# Patient Record
Sex: Female | Born: 1935 | Race: White | Hispanic: No
Health system: Southern US, Community
[De-identification: ages and names within clinical notes are randomized; demographics above are authoritative.]

---

## 2016-10-17 ENCOUNTER — Inpatient Hospital Stay
Admission: AD | Admit: 2016-10-17 | Discharge: 2016-11-25 | Disposition: A | Discharge status: Skilled Nursing Facility | Payer: Self-pay | Source: Other Acute Inpatient Hospital | Attending: Internal Medicine | Admitting: Internal Medicine

## 2016-10-17 ENCOUNTER — Other Ambulatory Visit (HOSPITAL_COMMUNITY): Payer: Self-pay

## 2016-10-17 DIAGNOSIS — Z931 Gastrostomy status: Secondary | ICD-10-CM

## 2016-10-17 DIAGNOSIS — I639 Cerebral infarction, unspecified: Secondary | ICD-10-CM

## 2016-10-17 DIAGNOSIS — J9690 Respiratory failure, unspecified, unspecified whether with hypoxia or hypercapnia: Secondary | ICD-10-CM

## 2016-10-17 DIAGNOSIS — R109 Unspecified abdominal pain: Secondary | ICD-10-CM

## 2016-10-17 DIAGNOSIS — J189 Pneumonia, unspecified organism: Secondary | ICD-10-CM

## 2016-10-17 DIAGNOSIS — D72829 Elevated white blood cell count, unspecified: Secondary | ICD-10-CM

## 2016-10-17 DIAGNOSIS — Z431 Encounter for attention to gastrostomy: Secondary | ICD-10-CM

## 2016-10-17 DIAGNOSIS — Z9911 Dependence on respirator [ventilator] status: Secondary | ICD-10-CM

## 2016-10-17 MED ORDER — IOPAMIDOL (ISOVUE-300) INJECTION 61%
50.0000 mL | Freq: Once | INTRAVENOUS | Status: AC | PRN
  Administered 2016-10-17: 50 mL

## 2016-10-18 LAB — CBC
HEMATOCRIT: 26.2 % — AB (ref 36.0–46.0)
Hemoglobin: 8.6 g/dL — ABNORMAL LOW (ref 12.0–15.0)
MCH: 31.5 pg (ref 26.0–34.0)
MCHC: 32.8 g/dL (ref 30.0–36.0)
MCV: 96 fL (ref 78.0–100.0)
PLATELETS: 111 10*3/uL — AB (ref 150–400)
RBC: 2.73 MIL/uL — ABNORMAL LOW (ref 3.87–5.11)
RDW: 18.1 % — AB (ref 11.5–15.5)
WBC: 9.7 10*3/uL (ref 4.0–10.5)

## 2016-10-18 LAB — BASIC METABOLIC PANEL
Anion gap: 10 (ref 5–15)
BUN: 41 mg/dL — AB (ref 6–20)
CALCIUM: 7.9 mg/dL — AB (ref 8.9–10.3)
CO2: 28 mmol/L (ref 22–32)
CREATININE: 0.74 mg/dL (ref 0.44–1.00)
Chloride: 100 mmol/L — ABNORMAL LOW (ref 101–111)
GFR calc Af Amer: >60 mL/min (ref >60)
Glucose, Bld: 278 mg/dL — ABNORMAL HIGH (ref 65–99)
POTASSIUM: 3.9 mmol/L (ref 3.5–5.1)
SODIUM: 138 mmol/L (ref 135–145)

## 2016-10-23 LAB — URINALYSIS, ROUTINE W REFLEX MICROSCOPIC
Bacteria, UA: NONE SEEN
Bilirubin Urine: NEGATIVE
Glucose, UA: 50 mg/dL — AB
Hgb urine dipstick: NEGATIVE
Ketones, ur: NEGATIVE mg/dL
Nitrite: NEGATIVE
Protein, ur: NEGATIVE mg/dL
SPECIFIC GRAVITY, URINE: 1.009 (ref 1.005–1.030)
SQUAMOUS EPITHELIAL / LPF: NONE SEEN
pH: 7 (ref 5.0–8.0)

## 2016-10-24 ENCOUNTER — Other Ambulatory Visit (HOSPITAL_COMMUNITY): Payer: Self-pay

## 2016-10-24 LAB — COMPREHENSIVE METABOLIC PANEL
ALT: 29 U/L (ref 14–54)
AST: 31 U/L (ref 15–41)
Albumin: 2.1 g/dL — ABNORMAL LOW (ref 3.5–5.0)
Alkaline Phosphatase: 81 U/L (ref 38–126)
Anion gap: 8 (ref 5–15)
BUN: 32 mg/dL — ABNORMAL HIGH (ref 6–20)
CO2: 36 mmol/L — ABNORMAL HIGH (ref 22–32)
Calcium: 8.5 mg/dL — ABNORMAL LOW (ref 8.9–10.3)
Chloride: 96 mmol/L — ABNORMAL LOW (ref 101–111)
Creatinine, Ser: 0.64 mg/dL (ref 0.44–1.00)
GFR calc Af Amer: >60 mL/min (ref >60)
GFR calc non Af Amer: >60 mL/min (ref >60)
Glucose, Bld: 276 mg/dL — ABNORMAL HIGH (ref 65–99)
Potassium: 4.5 mmol/L (ref 3.5–5.1)
Sodium: 140 mmol/L (ref 135–145)
Total Bilirubin: 0.9 mg/dL (ref 0.3–1.2)
Total Protein: 5.2 g/dL — ABNORMAL LOW (ref 6.5–8.1)

## 2016-10-24 LAB — URINE CULTURE: Culture: <10000 — AB

## 2016-10-24 LAB — CBC
HCT: 30.2 % — ABNORMAL LOW (ref 36.0–46.0)
Hemoglobin: 9.2 g/dL — ABNORMAL LOW (ref 12.0–15.0)
MCH: 30.6 pg (ref 26.0–34.0)
MCHC: 30.5 g/dL (ref 30.0–36.0)
MCV: 100.3 fL — ABNORMAL HIGH (ref 78.0–100.0)
Platelets: UNDETERMINED 10*3/uL (ref 150–400)
RBC: 3.01 MIL/uL — ABNORMAL LOW (ref 3.87–5.11)
RDW: 18.6 % — ABNORMAL HIGH (ref 11.5–15.5)
WBC: 6.6 10*3/uL (ref 4.0–10.5)

## 2016-10-29 ENCOUNTER — Other Ambulatory Visit (HOSPITAL_COMMUNITY): Payer: Self-pay

## 2016-10-29 LAB — BASIC METABOLIC PANEL
Anion gap: 9 (ref 5–15)
BUN: 39 mg/dL — ABNORMAL HIGH (ref 6–20)
CHLORIDE: 94 mmol/L — AB (ref 101–111)
CO2: 41 mmol/L — AB (ref 22–32)
CREATININE: 0.68 mg/dL (ref 0.44–1.00)
Calcium: 9.2 mg/dL (ref 8.9–10.3)
GFR calc Af Amer: >60 mL/min (ref >60)
GFR calc non Af Amer: >60 mL/min (ref >60)
Glucose, Bld: 316 mg/dL — ABNORMAL HIGH (ref 65–99)
Potassium: 4.5 mmol/L (ref 3.5–5.1)
SODIUM: 144 mmol/L (ref 135–145)

## 2016-11-01 ENCOUNTER — Other Ambulatory Visit (HOSPITAL_COMMUNITY): Payer: Self-pay

## 2016-11-01 LAB — CBC WITH DIFFERENTIAL/PLATELET
BASOS PCT: 1 %
Basophils Absolute: 0.1 10*3/uL (ref 0.0–0.1)
Eosinophils Absolute: 0 10*3/uL (ref 0.0–0.7)
Eosinophils Relative: 0 %
HEMATOCRIT: 35.1 % — AB (ref 36.0–46.0)
HEMOGLOBIN: 9.5 g/dL — AB (ref 12.0–15.0)
LYMPHS ABS: 1.7 10*3/uL (ref 0.7–4.0)
LYMPHS PCT: 13 %
MCH: 30.4 pg (ref 26.0–34.0)
MCHC: 27.1 g/dL — AB (ref 30.0–36.0)
MCV: 112.1 fL — AB (ref 78.0–100.0)
MONOS PCT: 12 %
Monocytes Absolute: 1.6 10*3/uL — ABNORMAL HIGH (ref 0.1–1.0)
NEUTROS ABS: 9.9 10*3/uL — AB (ref 1.7–7.7)
Neutrophils Relative %: 74 %
Platelets: 280 10*3/uL (ref 150–400)
RBC: 3.13 MIL/uL — ABNORMAL LOW (ref 3.87–5.11)
RDW: 17.7 % — ABNORMAL HIGH (ref 11.5–15.5)
WBC MORPHOLOGY: INCREASED
WBC: 13.3 10*3/uL — ABNORMAL HIGH (ref 4.0–10.5)

## 2016-11-01 LAB — BLOOD GAS, ARTERIAL
ACID-BASE EXCESS: 14.2 mmol/L — AB (ref 0.0–2.0)
Acid-Base Excess: 16.3 mmol/L — ABNORMAL HIGH (ref 0.0–2.0)
Bicarbonate: 45.3 mmol/L — ABNORMAL HIGH (ref 20.0–28.0)
Bicarbonate: 40.1 mmol/L — ABNORMAL HIGH (ref 20.0–28.0)
Drawn by: 290171
FIO2: 100
FIO2: 0.4
LHR: 18 {breaths}/min
O2 CONTENT: 15 L/min
O2 SAT: 97.8 %
O2 Saturation: 99.1 %
PATIENT TEMPERATURE: 98.6
PCO2 ART: 69.9 mmHg — AB (ref 32.0–48.0)
PEEP: 5 cmH2O
PH ART: 7.377 (ref 7.350–7.450)
PO2 ART: 91 mmHg (ref 83.0–108.0)
Patient temperature: 97.1
VT: 400 mL
pH, Arterial: 7.171 — CL (ref 7.350–7.450)
pO2, Arterial: 168 mmHg — ABNORMAL HIGH (ref 83.0–108.0)

## 2016-11-01 LAB — COMPREHENSIVE METABOLIC PANEL
ALK PHOS: 120 U/L (ref 38–126)
ALT: 34 U/L (ref 14–54)
AST: 32 U/L (ref 15–41)
Albumin: 2.1 g/dL — ABNORMAL LOW (ref 3.5–5.0)
Anion gap: 8 (ref 5–15)
BILIRUBIN TOTAL: 0.4 mg/dL (ref 0.3–1.2)
BUN: 47 mg/dL — AB (ref 6–20)
CALCIUM: 9.2 mg/dL (ref 8.9–10.3)
CHLORIDE: 95 mmol/L — AB (ref 101–111)
CO2: 44 mmol/L — ABNORMAL HIGH (ref 22–32)
CREATININE: 0.96 mg/dL (ref 0.44–1.00)
GFR, EST NON AFRICAN AMERICAN: 55 mL/min — AB (ref >60)
Glucose, Bld: 351 mg/dL — ABNORMAL HIGH (ref 65–99)
Potassium: 4.5 mmol/L (ref 3.5–5.1)
Sodium: 147 mmol/L — ABNORMAL HIGH (ref 135–145)
TOTAL PROTEIN: 6.5 g/dL (ref 6.5–8.1)

## 2016-11-01 LAB — URINALYSIS, ROUTINE W REFLEX MICROSCOPIC
Bilirubin Urine: NEGATIVE
GLUCOSE, UA: 50 mg/dL — AB
KETONES UR: NEGATIVE mg/dL
NITRITE: NEGATIVE
PROTEIN: 30 mg/dL — AB
Specific Gravity, Urine: 1.01 (ref 1.005–1.030)
Squamous Epithelial / LPF: NONE SEEN
pH: 6 (ref 5.0–8.0)

## 2016-11-01 LAB — LACTIC ACID, PLASMA: Lactic Acid, Venous: 1.3 mmol/L (ref 0.5–1.9)

## 2016-11-01 LAB — PHOSPHORUS: PHOSPHORUS: 6.1 mg/dL — AB (ref 2.5–4.6)

## 2016-11-01 LAB — MAGNESIUM: MAGNESIUM: 2.2 mg/dL (ref 1.7–2.4)

## 2016-11-03 LAB — BLOOD GAS, ARTERIAL
ACID-BASE EXCESS: 18.1 mmol/L — AB (ref 0.0–2.0)
BICARBONATE: 43.3 mmol/L — AB (ref 20.0–28.0)
FIO2: 35
MECHVT: 400 mL
O2 Saturation: 93.6 %
PEEP: 5 cmH2O
PH ART: 7.469 — AB (ref 7.350–7.450)
Patient temperature: 98.6
RATE: 24 resp/min
pCO2 arterial: 60.3 mmHg — ABNORMAL HIGH (ref 32.0–48.0)
pO2, Arterial: 66.8 mmHg — ABNORMAL LOW (ref 83.0–108.0)

## 2016-11-03 LAB — URINE CULTURE: Culture: >=100000 — AB

## 2016-11-04 ENCOUNTER — Other Ambulatory Visit (HOSPITAL_COMMUNITY): Payer: Self-pay

## 2016-11-04 ENCOUNTER — Encounter: Payer: Self-pay | Admitting: Radiology

## 2016-11-04 LAB — BLOOD GAS, ARTERIAL
ACID-BASE EXCESS: 17.8 mmol/L — AB (ref 0.0–2.0)
BICARBONATE: 42 mmol/L — AB (ref 20.0–28.0)
FIO2: 30
LHR: 16 {breaths}/min
Mode: 400
O2 SAT: 97.4 %
PATIENT TEMPERATURE: 97.4
PEEP: 5 cmH2O
pCO2 arterial: 45.2 mmHg (ref 32.0–48.0)
pH, Arterial: 7.572 — ABNORMAL HIGH (ref 7.350–7.450)
pO2, Arterial: 78.9 mmHg — ABNORMAL LOW (ref 83.0–108.0)

## 2016-11-04 LAB — BASIC METABOLIC PANEL
ANION GAP: 10 (ref 5–15)
BUN: 57 mg/dL — ABNORMAL HIGH (ref 6–20)
CHLORIDE: 102 mmol/L (ref 101–111)
CO2: 42 mmol/L — ABNORMAL HIGH (ref 22–32)
CREATININE: 0.88 mg/dL (ref 0.44–1.00)
Calcium: 9.1 mg/dL (ref 8.9–10.3)
GFR calc non Af Amer: >60 mL/min (ref >60)
Glucose, Bld: 100 mg/dL — ABNORMAL HIGH (ref 65–99)
POTASSIUM: 3.5 mmol/L (ref 3.5–5.1)
SODIUM: 154 mmol/L — AB (ref 135–145)

## 2016-11-04 LAB — CBC
HCT: 33.7 % — ABNORMAL LOW (ref 36.0–46.0)
Hemoglobin: 9.7 g/dL — ABNORMAL LOW (ref 12.0–15.0)
MCH: 29.8 pg (ref 26.0–34.0)
MCHC: 28.8 g/dL — ABNORMAL LOW (ref 30.0–36.0)
MCV: 103.4 fL — ABNORMAL HIGH (ref 78.0–100.0)
Platelets: 243 10*3/uL (ref 150–400)
RBC: 3.26 MIL/uL — AB (ref 3.87–5.11)
RDW: 18.9 % — ABNORMAL HIGH (ref 11.5–15.5)
WBC: 22.3 10*3/uL — AB (ref 4.0–10.5)

## 2016-11-04 LAB — PHOSPHORUS: PHOSPHORUS: 1.3 mg/dL — AB (ref 2.5–4.6)

## 2016-11-04 LAB — MAGNESIUM: MAGNESIUM: 2 mg/dL (ref 1.7–2.4)

## 2016-11-05 LAB — MAGNESIUM: Magnesium: 2 mg/dL (ref 1.7–2.4)

## 2016-11-05 LAB — CBC WITH DIFFERENTIAL/PLATELET
BASOS PCT: 1 %
Basophils Absolute: 0.3 10*3/uL — ABNORMAL HIGH (ref 0.0–0.1)
Eosinophils Absolute: 0.5 10*3/uL (ref 0.0–0.7)
Eosinophils Relative: 2 %
HEMATOCRIT: 32.6 % — AB (ref 36.0–46.0)
HEMOGLOBIN: 9.5 g/dL — AB (ref 12.0–15.0)
LYMPHS ABS: 5.2 10*3/uL — AB (ref 0.7–4.0)
Lymphocytes Relative: 20 %
MCH: 29.7 pg (ref 26.0–34.0)
MCHC: 29.1 g/dL — ABNORMAL LOW (ref 30.0–36.0)
MCV: 101.9 fL — AB (ref 78.0–100.0)
MONO ABS: 1.8 10*3/uL — AB (ref 0.1–1.0)
MONOS PCT: 7 %
NEUTROS ABS: 18.4 10*3/uL — AB (ref 1.7–7.7)
Neutrophils Relative %: 70 %
Platelets: 246 10*3/uL (ref 150–400)
RBC: 3.2 MIL/uL — ABNORMAL LOW (ref 3.87–5.11)
RDW: 18.9 % — AB (ref 11.5–15.5)
WBC: 26.2 10*3/uL — ABNORMAL HIGH (ref 4.0–10.5)

## 2016-11-05 LAB — TROPONIN I
TROPONIN I: 0.18 ng/mL — AB (ref <0.03)
TROPONIN I: 0.18 ng/mL — AB (ref <0.03)
Troponin I: 0.14 ng/mL (ref <0.03)

## 2016-11-05 LAB — C DIFFICILE QUICK SCREEN W PCR REFLEX
C DIFFICILE (CDIFF) TOXIN: NEGATIVE
C Diff antigen: NEGATIVE
C Diff interpretation: NOT DETECTED

## 2016-11-05 LAB — BASIC METABOLIC PANEL WITH GFR
Anion gap: 14 (ref 5–15)
BUN: 59 mg/dL — ABNORMAL HIGH (ref 6–20)
CO2: 38 mmol/L — ABNORMAL HIGH (ref 22–32)
Calcium: 8.5 mg/dL — ABNORMAL LOW (ref 8.9–10.3)
Chloride: 96 mmol/L — ABNORMAL LOW (ref 101–111)
Creatinine, Ser: 0.99 mg/dL (ref 0.44–1.00)
GFR calc Af Amer: >60 mL/min (ref >60)
GFR calc non Af Amer: 53 mL/min — ABNORMAL LOW (ref >60)
Glucose, Bld: 209 mg/dL — ABNORMAL HIGH (ref 65–99)
Potassium: 3.6 mmol/L (ref 3.5–5.1)
Sodium: 148 mmol/L — ABNORMAL HIGH (ref 135–145)

## 2016-11-05 LAB — PHOSPHORUS: PHOSPHORUS: 2.1 mg/dL — AB (ref 2.5–4.6)

## 2016-11-06 LAB — CBC WITH DIFFERENTIAL/PLATELET
Basophils Absolute: 0 10*3/uL (ref 0.0–0.1)
Basophils Relative: 0 %
EOS ABS: 0.8 10*3/uL — AB (ref 0.0–0.7)
Eosinophils Relative: 3 %
HCT: 31.4 % — ABNORMAL LOW (ref 36.0–46.0)
Hemoglobin: 9.5 g/dL — ABNORMAL LOW (ref 12.0–15.0)
LYMPHS ABS: 5.1 10*3/uL — AB (ref 0.7–4.0)
LYMPHS PCT: 19 %
MCH: 30.6 pg (ref 26.0–34.0)
MCHC: 30.3 g/dL (ref 30.0–36.0)
MCV: 101.3 fL — ABNORMAL HIGH (ref 78.0–100.0)
MONOS PCT: 8 %
Monocytes Absolute: 2.2 10*3/uL — ABNORMAL HIGH (ref 0.1–1.0)
NEUTROS ABS: 18.9 10*3/uL — AB (ref 1.7–7.7)
Neutrophils Relative %: 70 %
PLATELETS: 260 10*3/uL (ref 150–400)
RBC: 3.1 MIL/uL — AB (ref 3.87–5.11)
RDW: 18.5 % — ABNORMAL HIGH (ref 11.5–15.5)
WBC: 27 10*3/uL — AB (ref 4.0–10.5)

## 2016-11-06 LAB — RENAL FUNCTION PANEL
Albumin: 1.7 g/dL — ABNORMAL LOW (ref 3.5–5.0)
Anion gap: 11 (ref 5–15)
BUN: 64 mg/dL — AB (ref 6–20)
CALCIUM: 8.2 mg/dL — AB (ref 8.9–10.3)
CHLORIDE: 99 mmol/L — AB (ref 101–111)
CO2: 37 mmol/L — AB (ref 22–32)
CREATININE: 1.02 mg/dL — AB (ref 0.44–1.00)
GFR calc Af Amer: 59 mL/min — ABNORMAL LOW (ref >60)
GFR calc non Af Amer: 51 mL/min — ABNORMAL LOW (ref >60)
GLUCOSE: 203 mg/dL — AB (ref 65–99)
Phosphorus: 3 mg/dL (ref 2.5–4.6)
Potassium: 3.8 mmol/L (ref 3.5–5.1)
SODIUM: 147 mmol/L — AB (ref 135–145)

## 2016-11-06 LAB — CULTURE, RESPIRATORY

## 2016-11-06 LAB — CULTURE, BLOOD (ROUTINE X 2)
CULTURE: NO GROWTH
Culture: NO GROWTH
Special Requests: ADEQUATE

## 2016-11-06 LAB — CULTURE, RESPIRATORY W GRAM STAIN: Culture: NORMAL

## 2016-11-06 LAB — MAGNESIUM: Magnesium: 2.1 mg/dL (ref 1.7–2.4)

## 2016-11-07 ENCOUNTER — Other Ambulatory Visit (HOSPITAL_COMMUNITY): Payer: Self-pay

## 2016-11-07 LAB — COMPREHENSIVE METABOLIC PANEL
ALK PHOS: 156 U/L — AB (ref 38–126)
ALT: 29 U/L (ref 14–54)
AST: 37 U/L (ref 15–41)
Albumin: 1.8 g/dL — ABNORMAL LOW (ref 3.5–5.0)
Anion gap: 12 (ref 5–15)
BUN: 64 mg/dL — ABNORMAL HIGH (ref 6–20)
CALCIUM: 8.1 mg/dL — AB (ref 8.9–10.3)
CO2: 39 mmol/L — ABNORMAL HIGH (ref 22–32)
CREATININE: 1.12 mg/dL — AB (ref 0.44–1.00)
Chloride: 97 mmol/L — ABNORMAL LOW (ref 101–111)
GFR calc Af Amer: 53 mL/min — ABNORMAL LOW (ref >60)
GFR, EST NON AFRICAN AMERICAN: 46 mL/min — AB (ref >60)
Glucose, Bld: 144 mg/dL — ABNORMAL HIGH (ref 65–99)
Potassium: 3.7 mmol/L (ref 3.5–5.1)
Sodium: 148 mmol/L — ABNORMAL HIGH (ref 135–145)
Total Bilirubin: 0.4 mg/dL (ref 0.3–1.2)
Total Protein: 5.8 g/dL — ABNORMAL LOW (ref 6.5–8.1)

## 2016-11-07 LAB — CBC WITH DIFFERENTIAL/PLATELET
BASOS PCT: 1 %
Basophils Absolute: 0.2 10*3/uL — ABNORMAL HIGH (ref 0.0–0.1)
EOS PCT: 6 %
Eosinophils Absolute: 1.2 10*3/uL — ABNORMAL HIGH (ref 0.0–0.7)
HEMATOCRIT: 31.9 % — AB (ref 36.0–46.0)
Hemoglobin: 9.7 g/dL — ABNORMAL LOW (ref 12.0–15.0)
LYMPHS PCT: 18 %
Lymphs Abs: 3.7 10*3/uL (ref 0.7–4.0)
MCH: 30.6 pg (ref 26.0–34.0)
MCHC: 30.4 g/dL (ref 30.0–36.0)
MCV: 100.6 fL — AB (ref 78.0–100.0)
MONOS PCT: 8 %
Monocytes Absolute: 1.6 10*3/uL — ABNORMAL HIGH (ref 0.1–1.0)
NEUTROS PCT: 67 %
Neutro Abs: 13.7 10*3/uL — ABNORMAL HIGH (ref 1.7–7.7)
PLATELETS: 255 10*3/uL (ref 150–400)
RBC: 3.17 MIL/uL — ABNORMAL LOW (ref 3.87–5.11)
RDW: 18.4 % — AB (ref 11.5–15.5)
WBC: 20.4 10*3/uL — ABNORMAL HIGH (ref 4.0–10.5)

## 2016-11-07 LAB — PHOSPHORUS: Phosphorus: 3.1 mg/dL (ref 2.5–4.6)

## 2016-11-07 LAB — MAGNESIUM: Magnesium: 2.3 mg/dL (ref 1.7–2.4)

## 2016-11-07 LAB — ACID FAST SMEAR (AFB, MYCOBACTERIA): Acid Fast Smear: NEGATIVE

## 2016-11-08 LAB — CBC
HCT: 33.1 % — ABNORMAL LOW (ref 36.0–46.0)
HEMOGLOBIN: 9.8 g/dL — AB (ref 12.0–15.0)
MCH: 30.5 pg (ref 26.0–34.0)
MCHC: 29.6 g/dL — AB (ref 30.0–36.0)
MCV: 103.1 fL — ABNORMAL HIGH (ref 78.0–100.0)
PLATELETS: 282 10*3/uL (ref 150–400)
RBC: 3.21 MIL/uL — ABNORMAL LOW (ref 3.87–5.11)
RDW: 18.4 % — ABNORMAL HIGH (ref 11.5–15.5)
WBC: 21 10*3/uL — ABNORMAL HIGH (ref 4.0–10.5)

## 2016-11-08 LAB — RENAL FUNCTION PANEL
ALBUMIN: 1.8 g/dL — AB (ref 3.5–5.0)
Anion gap: 12 (ref 5–15)
BUN: 66 mg/dL — AB (ref 6–20)
CALCIUM: 8.2 mg/dL — AB (ref 8.9–10.3)
CO2: 35 mmol/L — AB (ref 22–32)
CREATININE: 1.14 mg/dL — AB (ref 0.44–1.00)
Chloride: 99 mmol/L — ABNORMAL LOW (ref 101–111)
GFR calc Af Amer: 52 mL/min — ABNORMAL LOW (ref >60)
GFR calc non Af Amer: 45 mL/min — ABNORMAL LOW (ref >60)
GLUCOSE: 128 mg/dL — AB (ref 65–99)
PHOSPHORUS: 2.9 mg/dL (ref 2.5–4.6)
Potassium: 3.8 mmol/L (ref 3.5–5.1)
Sodium: 146 mmol/L — ABNORMAL HIGH (ref 135–145)

## 2016-11-08 LAB — MAGNESIUM: Magnesium: 2.3 mg/dL (ref 1.7–2.4)

## 2016-11-09 LAB — CULTURE, BAL-QUANTITATIVE W GRAM STAIN
Culture: 3000 — AB
Special Requests: NORMAL

## 2016-11-09 LAB — CULTURE, BAL-QUANTITATIVE

## 2016-11-10 ENCOUNTER — Other Ambulatory Visit (HOSPITAL_COMMUNITY): Payer: Self-pay

## 2016-11-10 LAB — CULTURE, BLOOD (ROUTINE X 2)
CULTURE: NO GROWTH
Culture: NO GROWTH
Special Requests: ADEQUATE
Special Requests: ADEQUATE

## 2016-11-11 LAB — CBC WITH DIFFERENTIAL/PLATELET
BASOS ABS: 0.1 10*3/uL (ref 0.0–0.1)
BASOS PCT: 0 %
EOS ABS: 2.9 10*3/uL — AB (ref 0.0–0.7)
EOS PCT: 13 %
HCT: 28.5 % — ABNORMAL LOW (ref 36.0–46.0)
Hemoglobin: 8.5 g/dL — ABNORMAL LOW (ref 12.0–15.0)
Lymphocytes Relative: 17 %
Lymphs Abs: 3.6 10*3/uL (ref 0.7–4.0)
MCH: 30 pg (ref 26.0–34.0)
MCHC: 29.8 g/dL — AB (ref 30.0–36.0)
MCV: 100.7 fL — ABNORMAL HIGH (ref 78.0–100.0)
MONO ABS: 0.6 10*3/uL (ref 0.1–1.0)
Monocytes Relative: 3 %
NEUTROS ABS: 14.1 10*3/uL — AB (ref 1.7–7.7)
Neutrophils Relative %: 67 %
PLATELETS: 236 10*3/uL (ref 150–400)
RBC: 2.83 MIL/uL — ABNORMAL LOW (ref 3.87–5.11)
RDW: 18.7 % — AB (ref 11.5–15.5)
WBC: 21.2 10*3/uL — ABNORMAL HIGH (ref 4.0–10.5)

## 2016-11-11 LAB — RENAL FUNCTION PANEL
ALBUMIN: 1.5 g/dL — AB (ref 3.5–5.0)
Anion gap: 13 (ref 5–15)
BUN: 76 mg/dL — AB (ref 6–20)
CO2: 32 mmol/L (ref 22–32)
CREATININE: 1.27 mg/dL — AB (ref 0.44–1.00)
Calcium: 8.3 mg/dL — ABNORMAL LOW (ref 8.9–10.3)
Chloride: 101 mmol/L (ref 101–111)
GFR calc Af Amer: 46 mL/min — ABNORMAL LOW (ref >60)
GFR, EST NON AFRICAN AMERICAN: 39 mL/min — AB (ref >60)
Glucose, Bld: 162 mg/dL — ABNORMAL HIGH (ref 65–99)
PHOSPHORUS: 3.3 mg/dL (ref 2.5–4.6)
Potassium: 4.2 mmol/L (ref 3.5–5.1)
SODIUM: 146 mmol/L — AB (ref 135–145)

## 2016-11-11 LAB — MAGNESIUM: MAGNESIUM: 2.5 mg/dL — AB (ref 1.7–2.4)

## 2016-11-14 LAB — RENAL FUNCTION PANEL
ALBUMIN: 1.5 g/dL — AB (ref 3.5–5.0)
ANION GAP: 10 (ref 5–15)
BUN: 68 mg/dL — AB (ref 6–20)
CHLORIDE: 102 mmol/L (ref 101–111)
CO2: 33 mmol/L — AB (ref 22–32)
Calcium: 8.3 mg/dL — ABNORMAL LOW (ref 8.9–10.3)
Creatinine, Ser: 0.98 mg/dL (ref 0.44–1.00)
GFR calc Af Amer: >60 mL/min (ref >60)
GFR calc non Af Amer: 54 mL/min — ABNORMAL LOW (ref >60)
GLUCOSE: 95 mg/dL (ref 65–99)
PHOSPHORUS: 3.3 mg/dL (ref 2.5–4.6)
POTASSIUM: 4.8 mmol/L (ref 3.5–5.1)
Sodium: 145 mmol/L (ref 135–145)

## 2016-11-14 LAB — BASIC METABOLIC PANEL
ANION GAP: 10 (ref 5–15)
BUN: 62 mg/dL — ABNORMAL HIGH (ref 6–20)
CO2: 34 mmol/L — ABNORMAL HIGH (ref 22–32)
Calcium: 8.5 mg/dL — ABNORMAL LOW (ref 8.9–10.3)
Chloride: 99 mmol/L — ABNORMAL LOW (ref 101–111)
Creatinine, Ser: 0.93 mg/dL (ref 0.44–1.00)
GFR calc Af Amer: >60 mL/min (ref >60)
GFR, EST NON AFRICAN AMERICAN: 57 mL/min — AB (ref >60)
GLUCOSE: 131 mg/dL — AB (ref 65–99)
POTASSIUM: 4.7 mmol/L (ref 3.5–5.1)
Sodium: 143 mmol/L (ref 135–145)

## 2016-11-14 LAB — CBC
HEMATOCRIT: 28 % — AB (ref 36.0–46.0)
Hemoglobin: 8.5 g/dL — ABNORMAL LOW (ref 12.0–15.0)
MCH: 30.6 pg (ref 26.0–34.0)
MCHC: 30.4 g/dL (ref 30.0–36.0)
MCV: 100.7 fL — AB (ref 78.0–100.0)
PLATELETS: 218 10*3/uL (ref 150–400)
RBC: 2.78 MIL/uL — ABNORMAL LOW (ref 3.87–5.11)
RDW: 18.9 % — AB (ref 11.5–15.5)
WBC: 15.3 10*3/uL — ABNORMAL HIGH (ref 4.0–10.5)

## 2016-11-14 LAB — MAGNESIUM: Magnesium: 2.4 mg/dL (ref 1.7–2.4)

## 2016-11-15 ENCOUNTER — Other Ambulatory Visit (HOSPITAL_COMMUNITY): Payer: Self-pay

## 2016-11-15 LAB — BRAIN NATRIURETIC PEPTIDE: B Natriuretic Peptide: 165 pg/mL — ABNORMAL HIGH (ref 0.0–100.0)

## 2016-11-18 ENCOUNTER — Other Ambulatory Visit (HOSPITAL_COMMUNITY): Payer: Self-pay

## 2016-11-18 LAB — RENAL FUNCTION PANEL
ALBUMIN: 1.5 g/dL — AB (ref 3.5–5.0)
ANION GAP: 11 (ref 5–15)
BUN: 40 mg/dL — AB (ref 6–20)
CALCIUM: 8.4 mg/dL — AB (ref 8.9–10.3)
CO2: 30 mmol/L (ref 22–32)
CREATININE: 0.8 mg/dL (ref 0.44–1.00)
Chloride: 98 mmol/L — ABNORMAL LOW (ref 101–111)
GFR calc Af Amer: >60 mL/min (ref >60)
GFR calc non Af Amer: >60 mL/min (ref >60)
GLUCOSE: 122 mg/dL — AB (ref 65–99)
PHOSPHORUS: 3.7 mg/dL (ref 2.5–4.6)
Potassium: 5 mmol/L (ref 3.5–5.1)
SODIUM: 139 mmol/L (ref 135–145)

## 2016-11-18 LAB — CBC
HEMATOCRIT: 28.9 % — AB (ref 36.0–46.0)
Hemoglobin: 8.7 g/dL — ABNORMAL LOW (ref 12.0–15.0)
MCH: 30.2 pg (ref 26.0–34.0)
MCHC: 30.1 g/dL (ref 30.0–36.0)
MCV: 100.3 fL — ABNORMAL HIGH (ref 78.0–100.0)
PLATELETS: 203 10*3/uL (ref 150–400)
RBC: 2.88 MIL/uL — ABNORMAL LOW (ref 3.87–5.11)
RDW: 19.6 % — AB (ref 11.5–15.5)
WBC: 12.9 10*3/uL — AB (ref 4.0–10.5)

## 2016-11-18 LAB — MAGNESIUM: Magnesium: 2.1 mg/dL (ref 1.7–2.4)

## 2016-11-19 ENCOUNTER — Other Ambulatory Visit (HOSPITAL_COMMUNITY): Payer: Self-pay

## 2016-11-19 ENCOUNTER — Encounter (HOSPITAL_COMMUNITY): Payer: Self-pay | Admitting: Interventional Radiology

## 2016-11-19 HISTORY — PX: IR REPLC GASTRO/COLONIC TUBE PERCUT W/FLUORO: IMG2333

## 2016-11-19 MED ORDER — IOPAMIDOL (ISOVUE-300) INJECTION 61%
INTRAVENOUS | Status: AC
  Administered 2016-11-19: 10 mL
  Filled 2016-11-19: qty 50

## 2016-11-19 MED ORDER — LIDOCAINE VISCOUS 2 % MT SOLN
OROMUCOSAL | Status: AC
Start: 2016-11-19 — End: 2016-11-20
  Filled 2016-11-19: qty 15

## 2016-11-19 NOTE — Procedures (Signed)
G tube exchange Tip in stomach EBL 0 Comp 0

## 2016-11-23 ENCOUNTER — Other Ambulatory Visit (HOSPITAL_COMMUNITY): Payer: Self-pay

## 2016-11-24 LAB — CBC
HCT: 28.3 % — ABNORMAL LOW (ref 36.0–46.0)
Hemoglobin: 8.6 g/dL — ABNORMAL LOW (ref 12.0–15.0)
MCH: 30.2 pg (ref 26.0–34.0)
MCHC: 30.4 g/dL (ref 30.0–36.0)
MCV: 99.3 fL (ref 78.0–100.0)
Platelets: 280 10*3/uL (ref 150–400)
RBC: 2.85 MIL/uL — ABNORMAL LOW (ref 3.87–5.11)
RDW: 18.4 % — AB (ref 11.5–15.5)
WBC: 13.9 10*3/uL — ABNORMAL HIGH (ref 4.0–10.5)

## 2016-11-24 LAB — BASIC METABOLIC PANEL
Anion gap: 11 (ref 5–15)
BUN: 32 mg/dL — ABNORMAL HIGH (ref 6–20)
CALCIUM: 8.4 mg/dL — AB (ref 8.9–10.3)
CO2: 30 mmol/L (ref 22–32)
CREATININE: 0.86 mg/dL (ref 0.44–1.00)
Chloride: 95 mmol/L — ABNORMAL LOW (ref 101–111)
GFR calc non Af Amer: >60 mL/min (ref >60)
Glucose, Bld: 177 mg/dL — ABNORMAL HIGH (ref 65–99)
Potassium: 4.5 mmol/L (ref 3.5–5.1)
SODIUM: 136 mmol/L (ref 135–145)

## 2016-11-25 ENCOUNTER — Other Ambulatory Visit (HOSPITAL_COMMUNITY): Payer: Self-pay

## 2016-12-20 LAB — ACID FAST CULTURE WITH REFLEXED SENSITIVITIES (MYCOBACTERIA): Acid Fast Culture: NEGATIVE

## 2017-01-24 DEATH — deceased

## 2017-10-31 IMAGING — DX DG ABD PORTABLE 1V
1 series · 1 of 1 positions shown · non-contrast
Comparison: None

CLINICAL DATA: Generalized abdominal pain today

EXAM:
PORTABLE ABDOMEN - 1 VIEW

[abdomen kub]
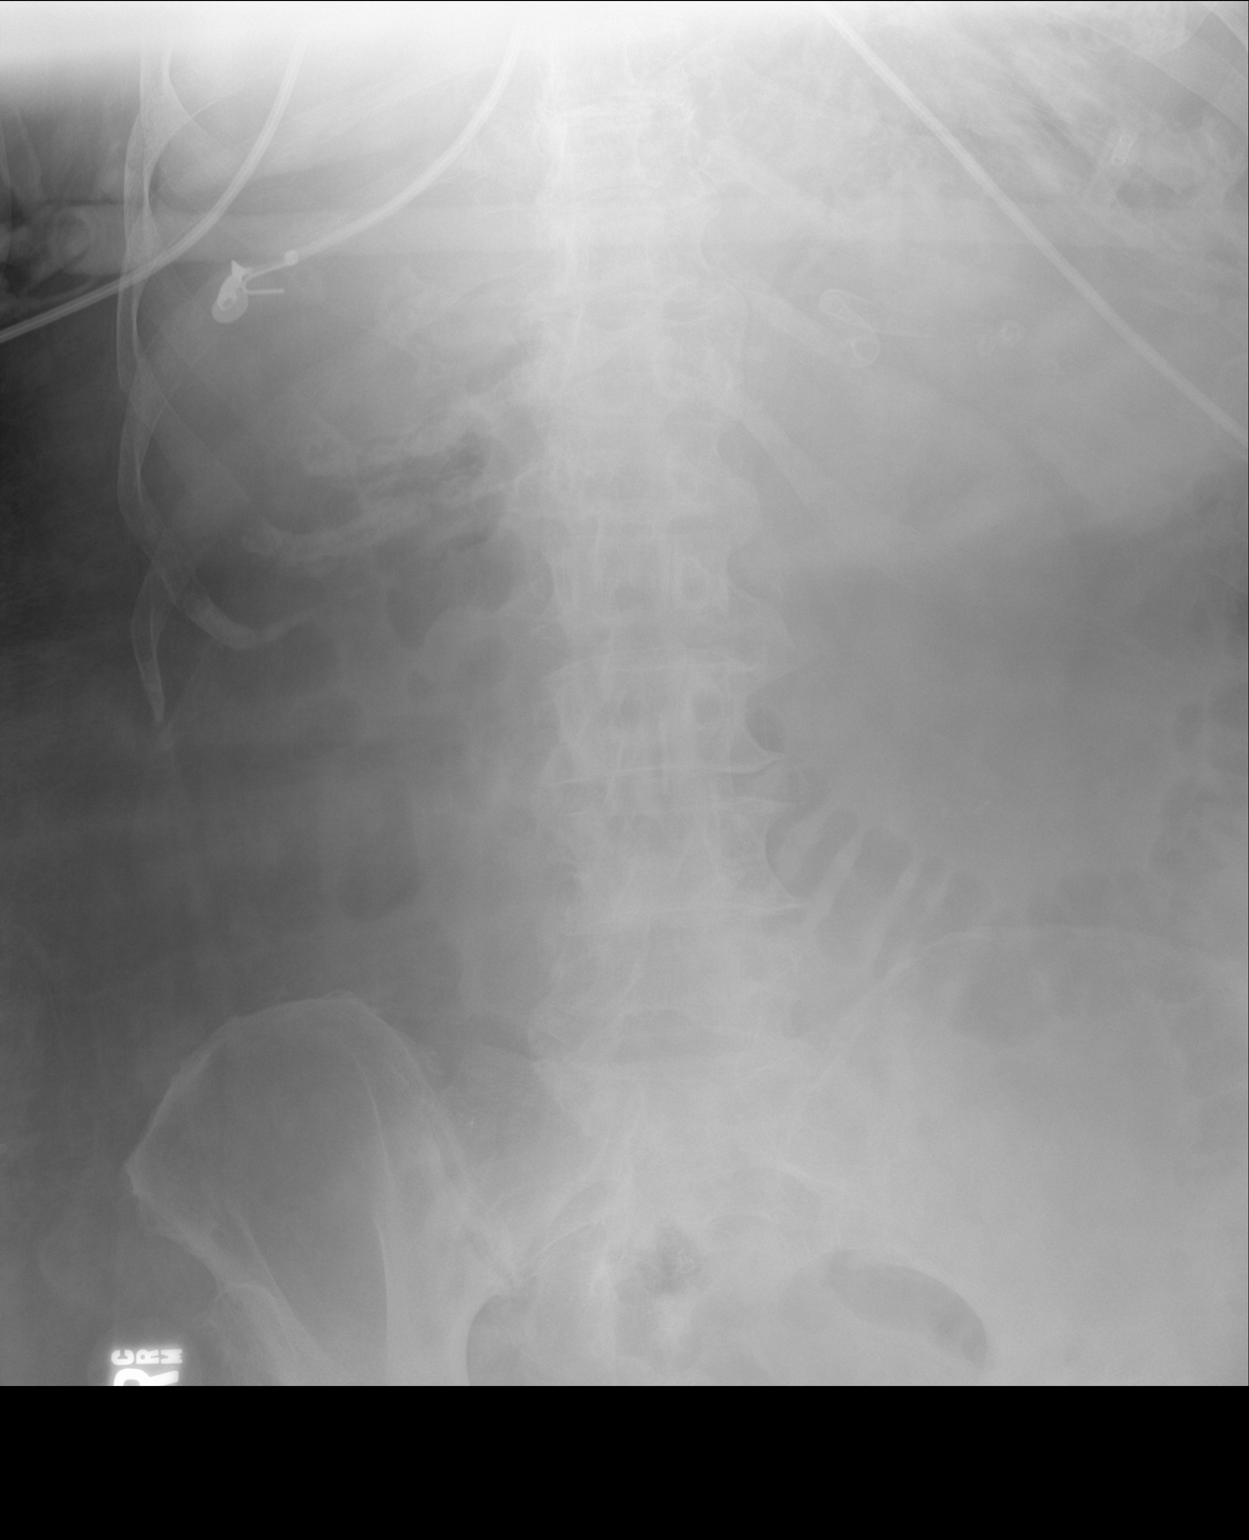

[1 of 1 positions shown; findings below may reference images not displayed]

FINDINGS: Scattered gas throughout colon, nondistended.

Nonobstructive bowel gas pattern.

No bowel dilatation or bowel wall thickening.

Bones demineralized.

Atherosclerotic calcifications at the splenic artery.

No definite urinary tract calcification or acute abnormality
identified.
IMPRESSION: No acute abnormalities.

## 2017-11-01 IMAGING — XA IR REPLACE G-TUBE/COLONIC TUBE
2 series · 6 of 6 positions shown · non-contrast
Comparison: none

INDICATION: Gastrostomy tube inadvertently removed. A Foley catheter is in the
gastrostomy tube site.

[Series 1: fl angio · 5 of 5 slices shown (1 of 2)]
[im 1/5]
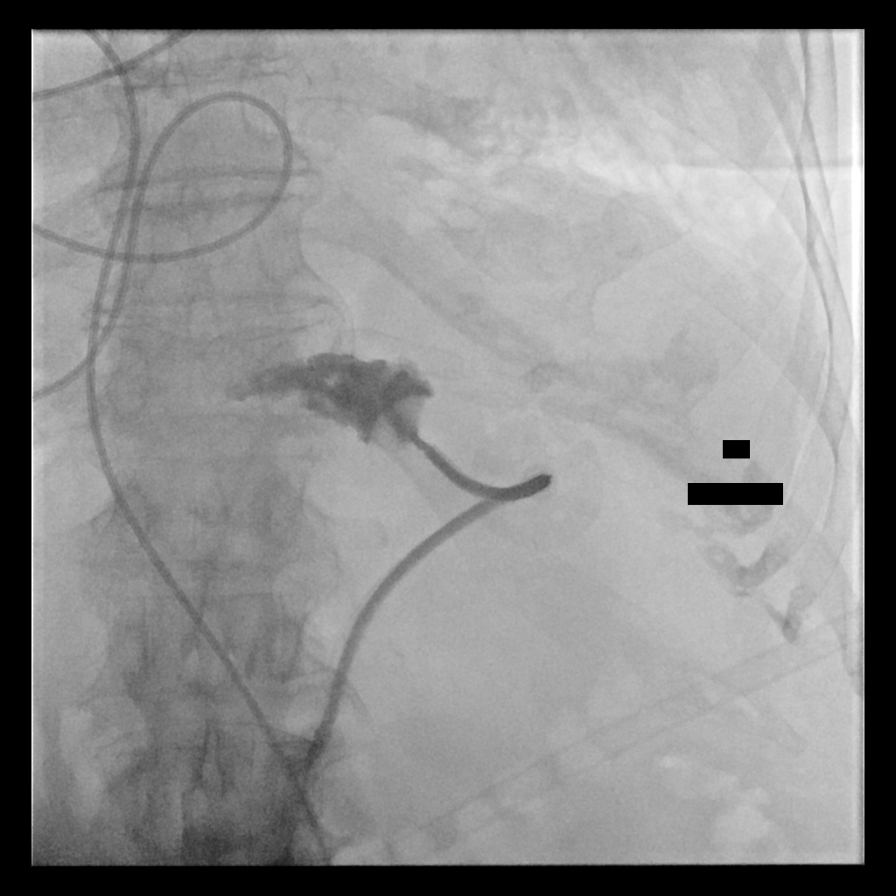
[im 2/5]
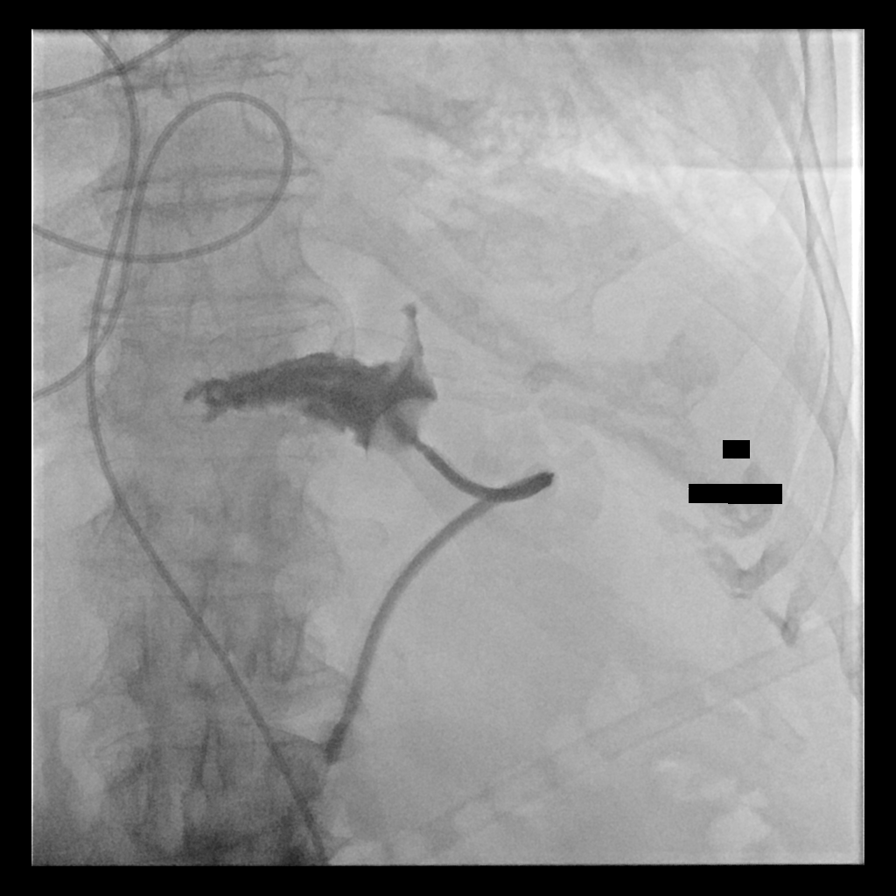
[im 3/5]
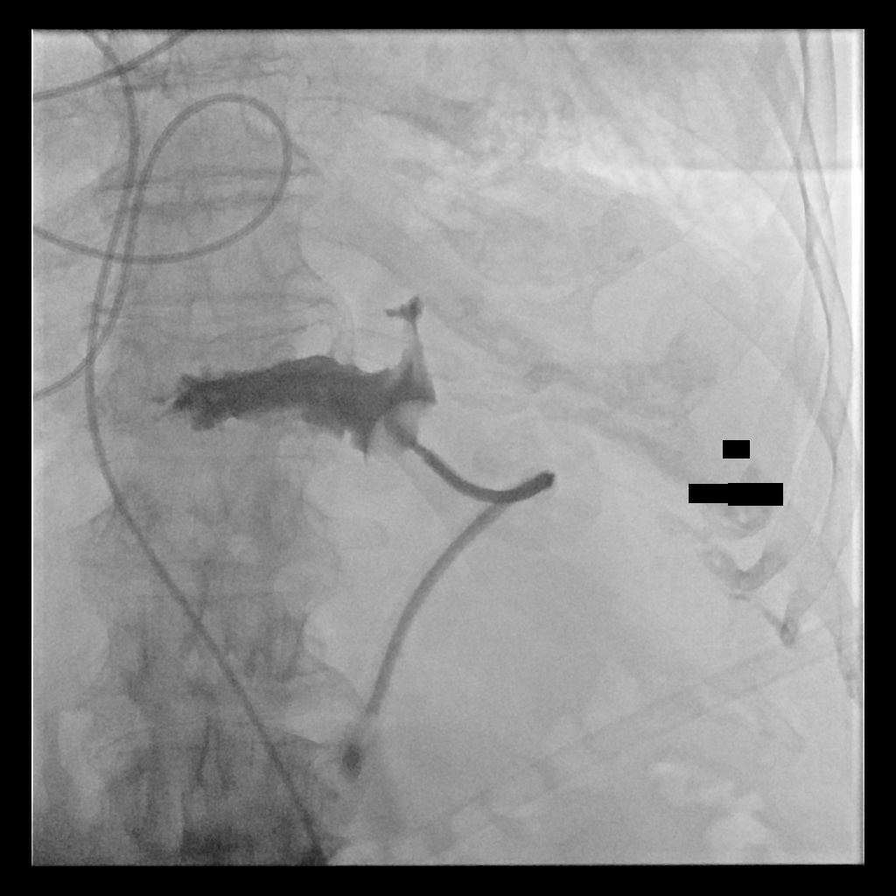
[im 4/5]
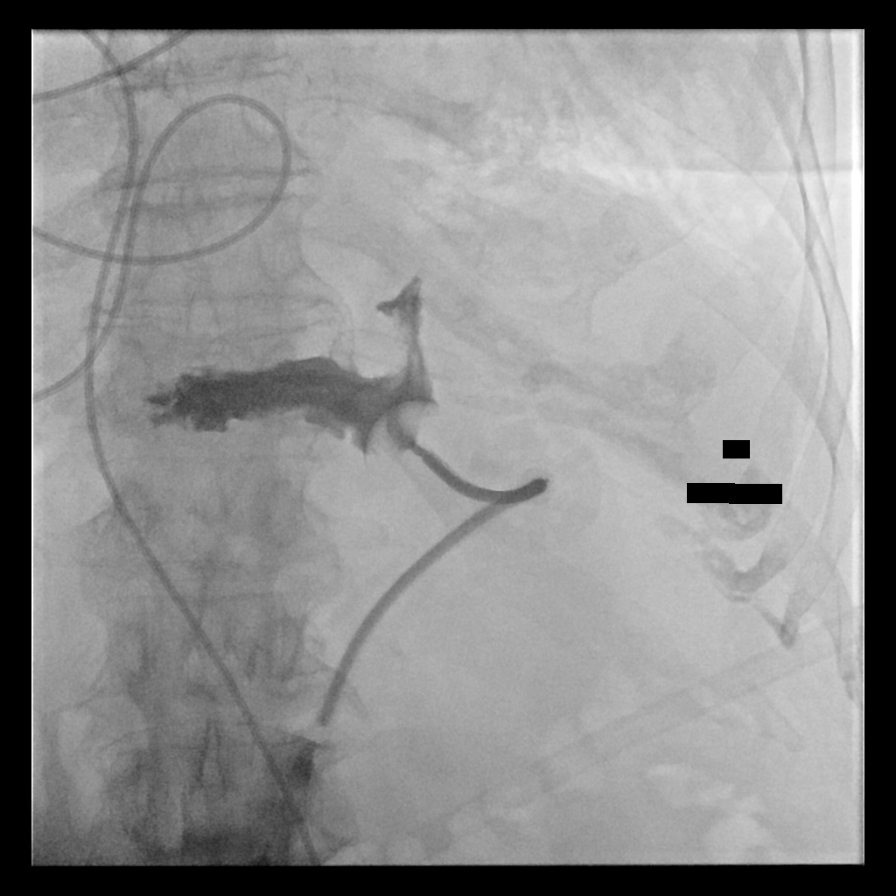
[im 5/5]
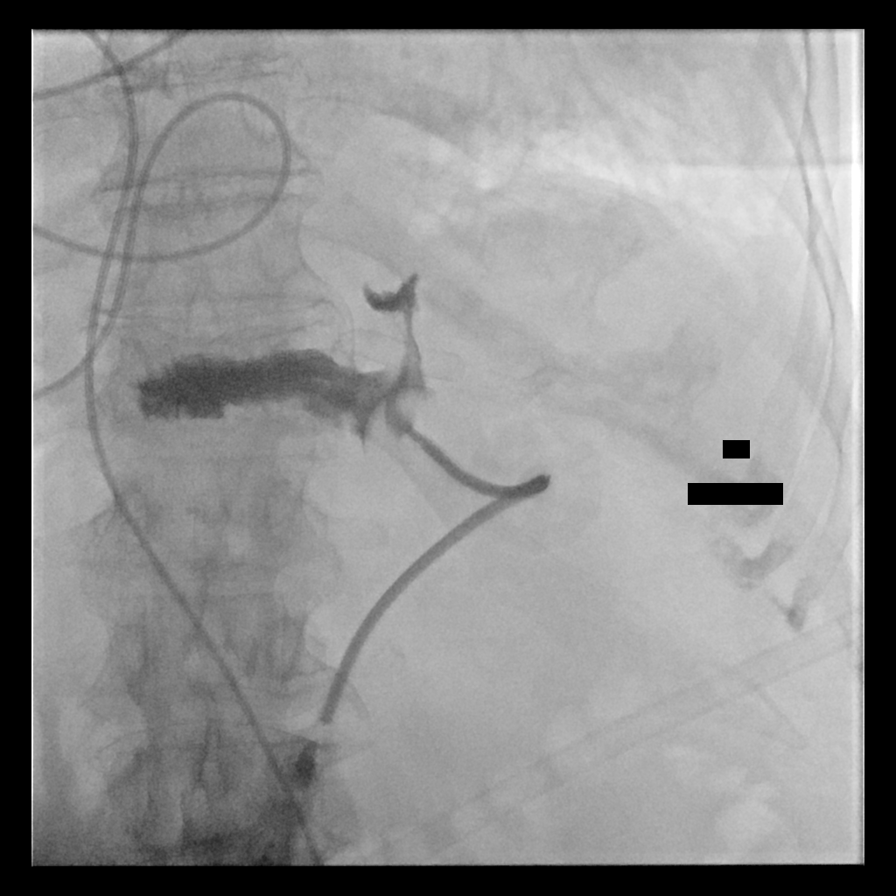

[Series 2: fl angio · 1 of 1 slices shown (2 of 2)]
[im 1/1]
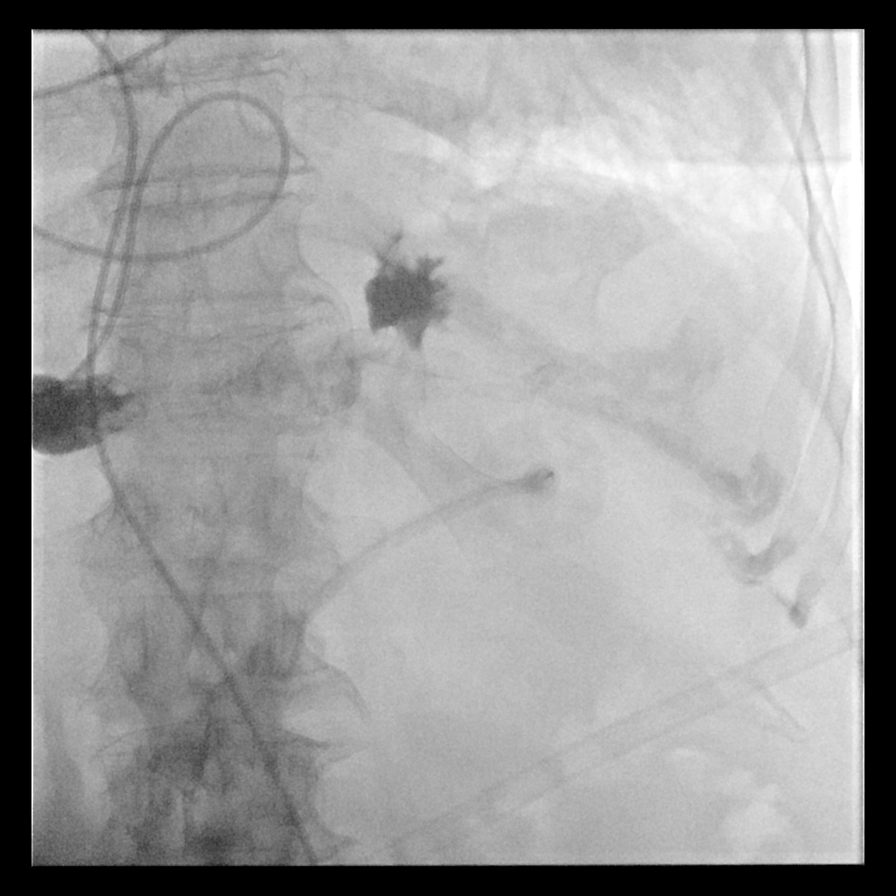

[6 of 6 positions shown; findings below may reference images not displayed]

EXAM:
GASTROSTOMY TUBE REPLACEMENT

MEDICATIONS:
None

ANESTHESIA/SEDATION:
None

CONTRAST:  10 cc Isovue 300 - administered into the gastric lumen.

FLUOROSCOPY TIME:  Fluoroscopy Time: 0 minutes 12 seconds (10 mGy).

COMPLICATIONS:
None immediate.

PROCEDURE:
The Foley catheter was removed and a 18 French gastrostomy tube was
advanced into the gastrostomy tube site. The balloon was insufflated
with 10 cc saline. Contrast was injected.
FINDINGS: Tip of the gastrostomy tube is in the body of the stomach.
IMPRESSION: Successful 18 French gastrostomy tube placement. It is positioned in
the body of the stomach. It is functioning normal.

## 2017-11-07 IMAGING — DX DG CHEST 1V PORT
1 series · 1 of 1 positions shown · non-contrast
Comparison: November 23, 2016

CLINICAL DATA: Recent pneumonia with difficulty breathing

EXAM:
PORTABLE CHEST 1 VIEW

[chest ap]
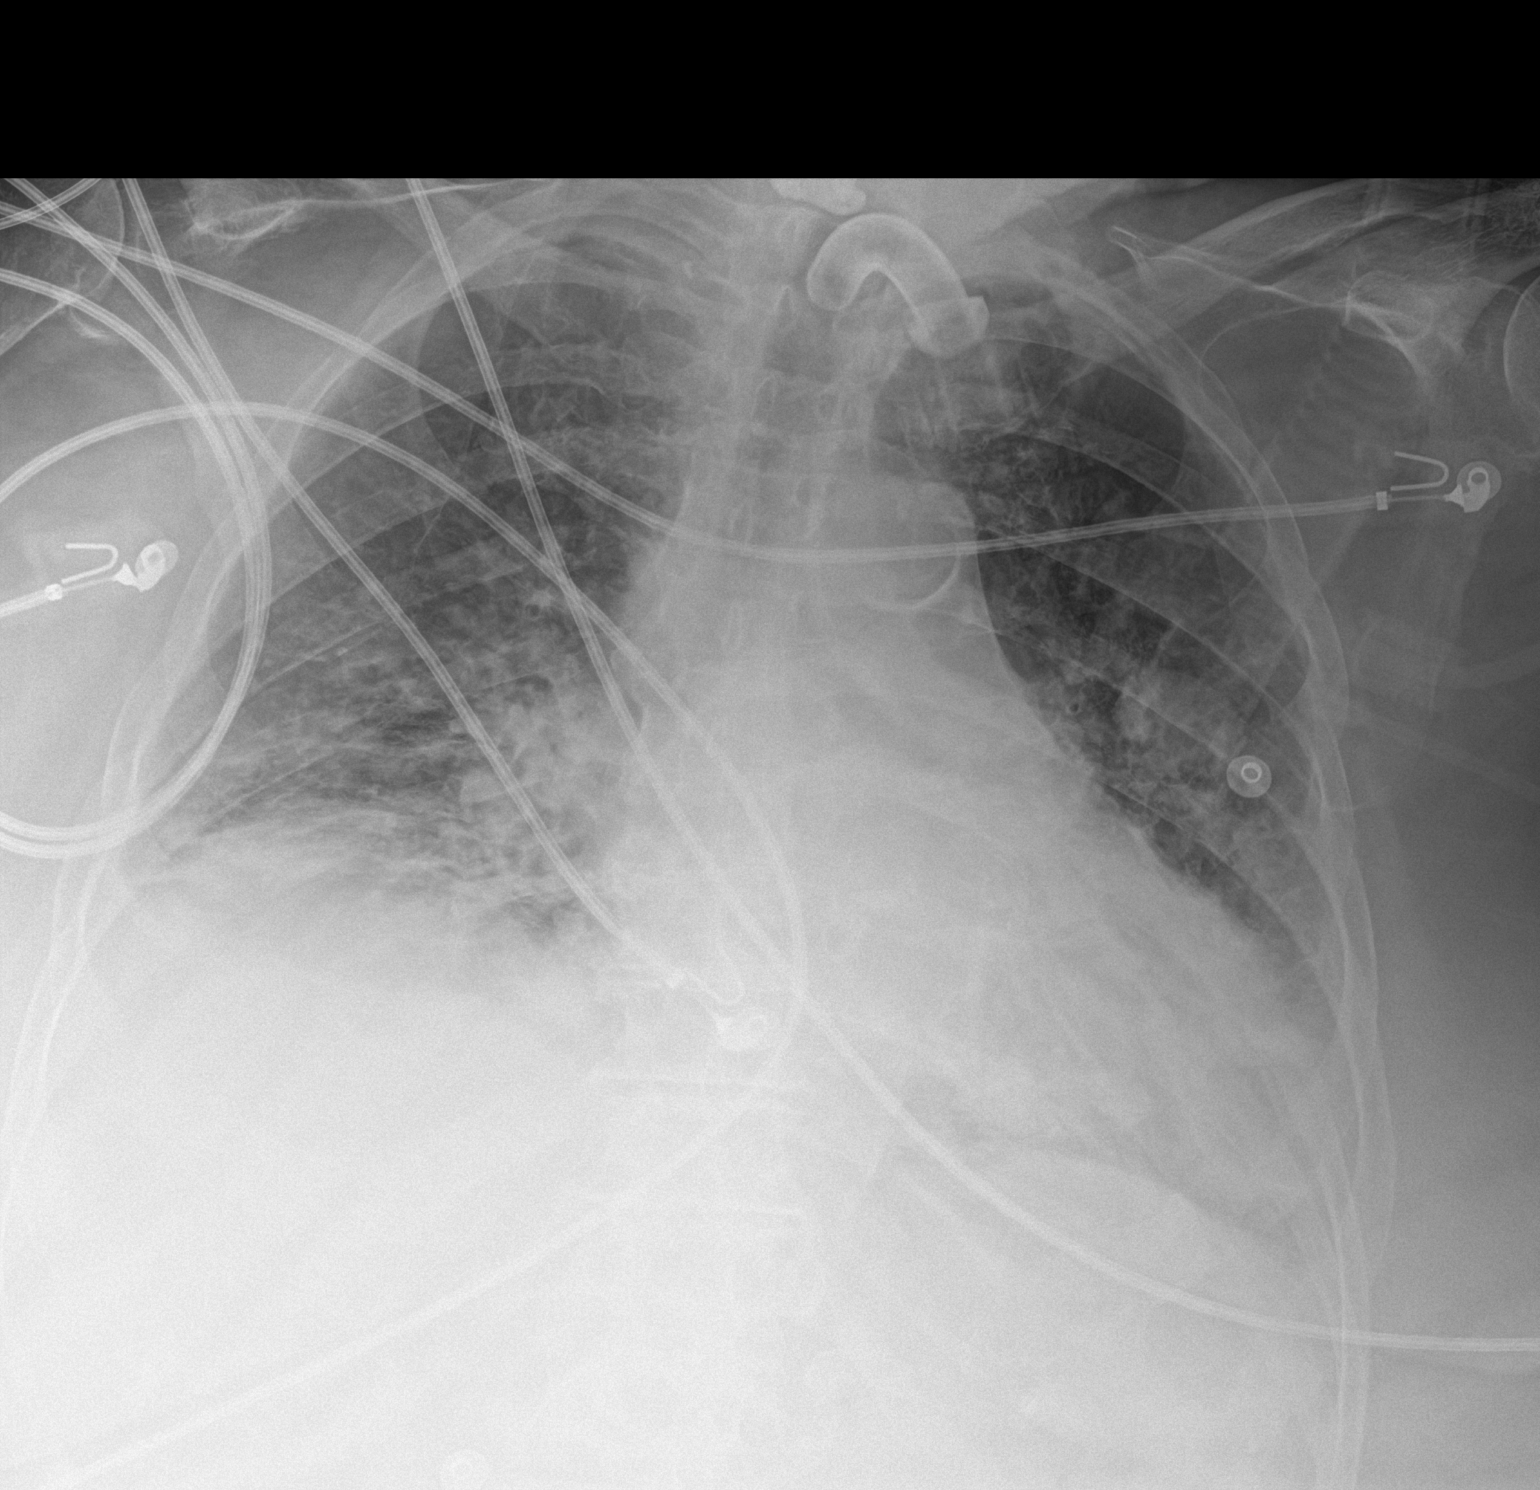

[1 of 1 positions shown; findings below may reference images not displayed]

FINDINGS: Tracheostomy catheter tip is 7.0 cm above the carina. No
pneumothorax. There is patchy interstitial edema bilaterally,
stable. There is bibasilar atelectasis, stable. No new opacity.
There is cardiomegaly with mild pulmonary venous hypertension. No
adenopathy. There is aortic atherosclerosis. There is postoperative
change in the lower cervical spine.
IMPRESSION: Findings indicative of a degree of congestive heart failure,
essentially stable from recent study. Bibasilar atelectasis. Stable
cardiac silhouette. There is aortic atherosclerosis.

Aortic Atherosclerosis ([id number]-L29.9).
